# Patient Record
Sex: Female | Born: 1995 | Race: White | Hispanic: No | Marital: Single
Health system: Southern US, Community
[De-identification: ages and names within clinical notes are randomized; demographics above are authoritative.]

---

## 2016-05-25 ENCOUNTER — Emergency Department
Admission: EM | Admit: 2016-05-25 | Discharge: 2016-05-25 | Disposition: A | Discharge status: Home / Self Care | Payer: 59 | Attending: Emergency Medicine | Admitting: Emergency Medicine

## 2016-05-25 ENCOUNTER — Emergency Department: Payer: 59

## 2016-05-25 DIAGNOSIS — M940 Chondrocostal junction syndrome [Tietze]: Secondary | ICD-10-CM | POA: Diagnosis not present

## 2016-05-25 DIAGNOSIS — R05 Cough: Secondary | ICD-10-CM | POA: Diagnosis present

## 2016-05-25 DIAGNOSIS — B9789 Other viral agents as the cause of diseases classified elsewhere: Secondary | ICD-10-CM

## 2016-05-25 DIAGNOSIS — J069 Acute upper respiratory infection, unspecified: Secondary | ICD-10-CM | POA: Diagnosis not present

## 2016-05-25 MED ORDER — BENZONATATE 100 MG PO CAPS
200.0000 mg | ORAL_CAPSULE | Freq: Once | ORAL | Status: AC
  Administered 2016-05-25: 200 mg via ORAL
  Filled 2016-05-25: qty 2

## 2016-05-25 MED ORDER — IBUPROFEN 600 MG PO TABS
600.0000 mg | ORAL_TABLET | Freq: Once | ORAL | Status: AC
  Administered 2016-05-25: 600 mg via ORAL
  Filled 2016-05-25: qty 1

## 2016-05-25 NOTE — ED Notes (Signed)
Pt is in good condition, discharge instructions reviewed, follow up care and home care reviewed; pt verbalized understanding; pt is ambulatory and left ED with friends

## 2016-05-25 NOTE — ED Provider Notes (Signed)
[facility] Emergency Department Provider Note    First MD Initiated Contact with Patient 05/25/16 0411     (approximate)  I have reviewed the triage vital signs and the nursing notes.   HISTORY  Chief Complaint Cough    HPI Brittany Daugherty is a 20 y.o. female presents with cough congestion 3 days. Patient now admits to central/left rib/chest soreness. Patient states the pain is worse with deep inspiration. Pain score is currently 5 out of 10. Patient denies any fever afebrile on presentation.   Past medical history None There are no active problems to display for this patient.   Past surgical history None  Prior to Admission medications   Not on File    Allergies No known drug allergies No family history on file.  Social History Social History  Substance Use Topics  . Smoking status: Not on file  . Smokeless tobacco: Not on file  . Alcohol use Not on file    Review of Systems Constitutional: No fever/chills Eyes: No visual changes. ENT: No sore throat. Cardiovascular: Denies chest pain. Respiratory: Denies shortness of breath. Gastrointestinal: No abdominal pain.  No nausea, no vomiting.  No diarrhea.  No constipation. Genitourinary: Negative for dysuria. Musculoskeletal: Negative for back pain. Skin: Negative for rash. Neurological: Negative for headaches, focal weakness or numbness.  10-point ROS otherwise negative.  ____________________________________________   PHYSICAL EXAM:  VITAL SIGNS: ED Triage Vitals [05/25/16 0058]  Enc Vitals Group     BP 133/85     Pulse Rate 78     Resp 18     Temp 97.8 F (36.6 C)     Temp Source Oral     SpO2 100 %     Weight 135 lb (61.2 kg)     Height 5\' 5"  (1.651 m)     Head Circumference      Peak Flow      Pain Score 5     Pain Loc      Pain Edu?      Excl. in GC?     Constitutional: Alert and oriented. Well appearing and in no acute distress. Eyes: Conjunctivae are  normal. PERRL. EOMI. Head: Atraumatic. Ears:  Healthy appearing ear canals and TMs bilaterally Nose: No congestion/rhinnorhea. Mouth/Throat: Mucous membranes are moist.  Oropharynx non-erythematous. Neck: No stridor.   Cardiovascular: Normal rate, regular rhythm. Good peripheral circulation. Grossly normal heart sounds. Pain parasternal palpation Respiratory: Normal respiratory effort.  No retractions. Lungs CTAB. Gastrointestinal: Soft and nontender. No distention.  Musculoskeletal: No lower extremity tenderness nor edema. No gross deformities of extremities. Neurologic:  Normal speech and language. No gross focal neurologic deficits are appreciated.  Skin:  Skin is warm, dry and intact. No rash noted. Psychiatric: Mood and affect are normal. Speech and behavior are normal.  ___  RADIOLOGY I, Shawmut N Rhylin Venters, personally viewed and evaluated these images (plain radiographs) as part of my medical decision making, as well as reviewing the written report by the radiologist.  Dg Chest 2 View  Result Date: 05/25/2016 CLINICAL DATA:  Cough and congestion EXAM: CHEST  2 VIEW COMPARISON:  None. FINDINGS: Cardiomediastinal contours are normal. No pneumothorax or pleural effusion. No focal airspace consolidation or pulmonary edema. IMPRESSION: Clear lungs. Electronically Signed   By: Deatra RobinsonKevin  Herman M.D.   On: 05/25/2016 04:20      Procedures     INITIAL IMPRESSION / ASSESSMENT AND PLAN / ED COURSE  Pertinent labs & imaging results that were  available during my care of the patient were reviewed by me and considered in my medical decision making (see chart for details).     Clinical Course     ____________________________________________  FINAL CLINICAL IMPRESSION(S) / ED DIAGNOSES  Final diagnoses:  Viral URI with cough  Costochondritis, acute     MEDICATIONS GIVEN DURING THIS VISIT:  Medications  benzonatate (TESSALON) capsule 200 mg (200 mg Oral Given 05/25/16 0437)    ibuprofen (ADVIL,MOTRIN) tablet 600 mg (600 mg Oral Given 05/25/16 0438)     NEW OUTPATIENT MEDICATIONS STARTED DURING THIS VISIT:  New Prescriptions   No medications on file    Modified Medications   No medications on file    Discontinued Medications   No medications on file     Note:  This document was prepared using Dragon voice recognition software and may include unintentional dictation errors.    Darci Currentandolph N Mckennon Zwart, MD 05/25/16 [phone]

## 2016-05-25 NOTE — ED Triage Notes (Signed)
Pt in with co cough and congestion for few days, now having left rib and chest soreness.

## 2017-08-07 ENCOUNTER — Other Ambulatory Visit: Payer: Self-pay | Admitting: Neurology

## 2017-08-07 DIAGNOSIS — S060X0D Concussion without loss of consciousness, subsequent encounter: Secondary | ICD-10-CM

## 2017-08-14 ENCOUNTER — Ambulatory Visit
Admission: RE | Admit: 2017-08-14 | Discharge: 2017-08-14 | Disposition: A | Discharge status: Home / Self Care | Payer: 59 | Source: Ambulatory Visit | Attending: Neurology | Admitting: Neurology

## 2017-08-14 DIAGNOSIS — X58XXXD Exposure to other specified factors, subsequent encounter: Secondary | ICD-10-CM | POA: Insufficient documentation

## 2017-08-14 DIAGNOSIS — S060X0D Concussion without loss of consciousness, subsequent encounter: Secondary | ICD-10-CM | POA: Diagnosis present

## 2017-08-14 MED ORDER — GADOBENATE DIMEGLUMINE 529 MG/ML IV SOLN
12.0000 mL | Freq: Once | INTRAVENOUS | Status: AC | PRN
  Administered 2017-08-14: 12 mL via INTRAVENOUS

## 2019-03-01 IMAGING — MR MR HEAD WO/W CM
12 series · 48 of 48 positions shown · IV contrast (12mL MULTIHANCE)
Comparison: None.

CLINICAL DATA: Concussion 1 month ago with subsequent headaches and
dizziness, now predominately exertional.

EXAM:
MRI HEAD WITHOUT AND WITH CONTRAST
TECHNIQUE: Multiplanar, multiecho pulse sequences of the brain and surrounding
structures were obtained without and with intravenous contrast.
CONTRAST:  12mL MULTIHANCE GADOBENATE DIMEGLUMINE 529 MG/ML IV SOLN

[Series 2: T1 · sagittal · 5.0mm · 0.45mm/px · 1 of 23 slices shown (1 of 2)]
[im 1/23]
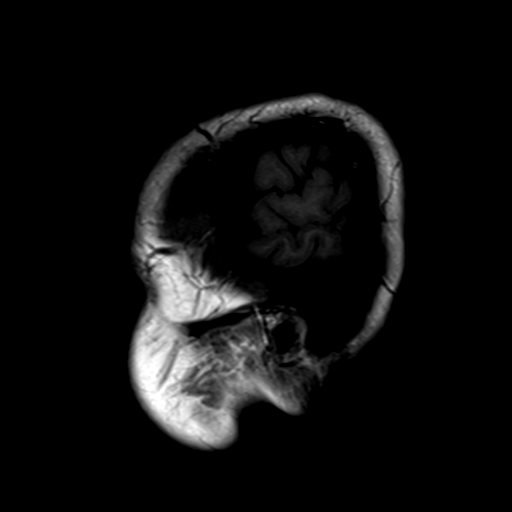

[Series 4: DWI · axial · 3.0mm · 1.80mm/px · z∈[-75,+85]mm · 4 of 55 slices shown (1 of 2)]
[im 1/55]
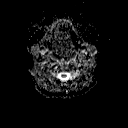
[im 19/55]
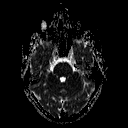
[im 37/55]
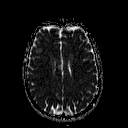
[im 55/55]
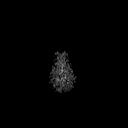

[Series 6: DWI · coronal · 3.0mm · 1.80mm/px · 3 of 47 slices shown (2 of 2)]
[im 1/47]
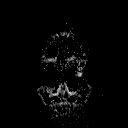
[im 24/47]
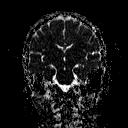
[im 47/47]
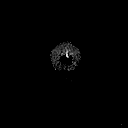

[Series 7: T2 · axial · 5.0mm · 0.60mm/px · z∈[-67,+87]mm · 2 of 25 slices shown (1 of 2)]
[im 1/25]
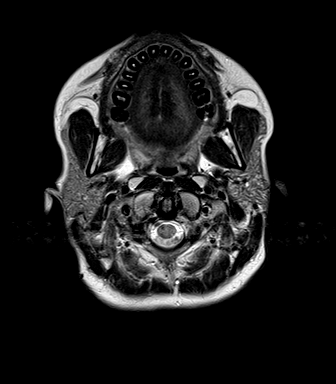
[im 25/25]
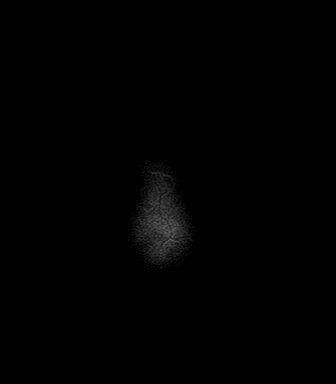

[Series 8: FLAIR · axial · 3.0mm · 0.45mm/px · z∈[-68,+86]mm · 3 of 53 slices shown]
[im 1/53]
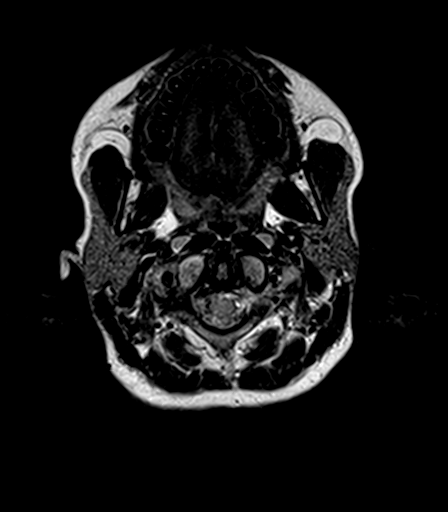
[im 27/53]
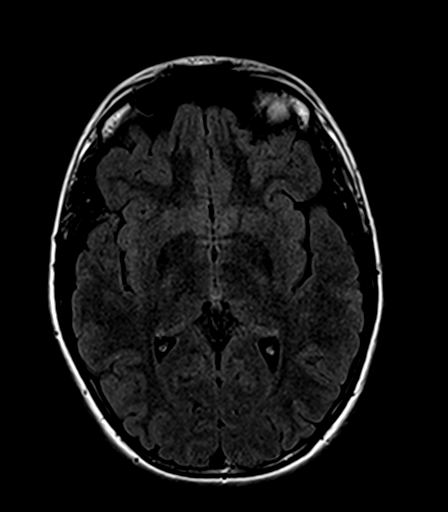
[im 53/53]
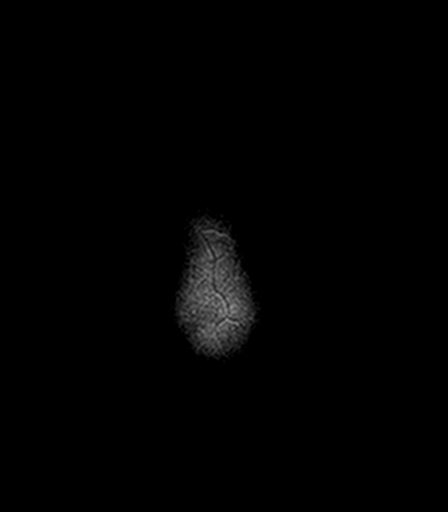

[Series 9: T2 · axial · 5.0mm · 0.45mm/px · z∈[-67,+87]mm · 2 of 25 slices shown (2 of 2)]
[im 1/25]
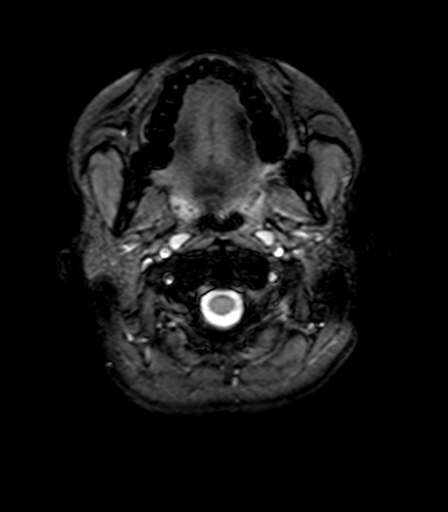
[im 25/25]
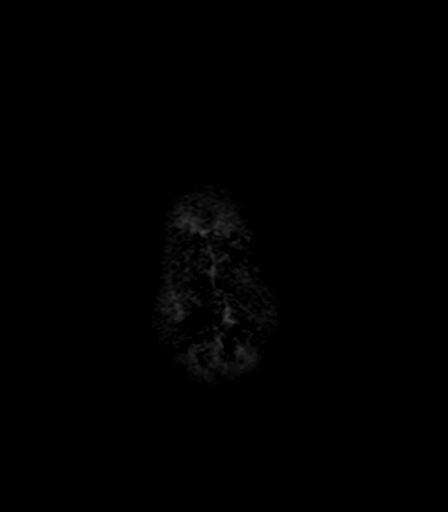

[Series 10: T1 · axial · 1.0mm · 1.00mm/px · z∈[-79,+94]mm · 11 of 176 slices shown (2 of 2)]
[im 1/176]
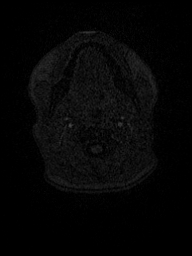
[im 18/176]
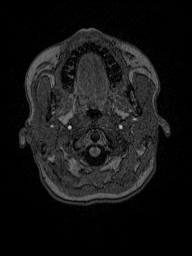
[im 36/176]
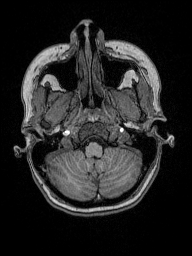
[im 53/176]
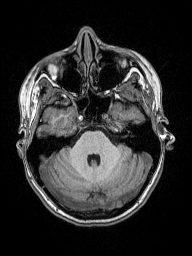
[im 71/176]
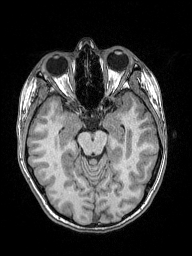
[im 88/176]
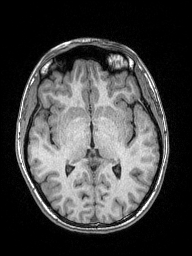
[im 106/176]
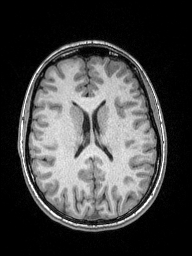
[im 123/176]
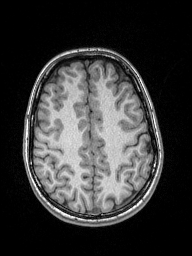
[im 141/176]
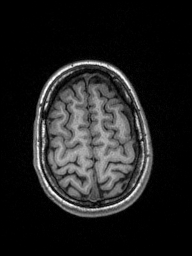
[im 158/176]
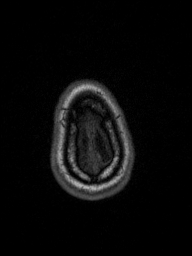
[im 176/176]
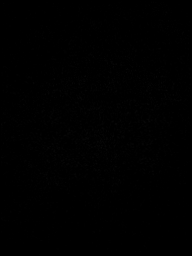

[Series 11: T2 post-contrast · coronal · 5.0mm · 0.49mm/px · 2 of 29 slices shown]
[im 1/29]
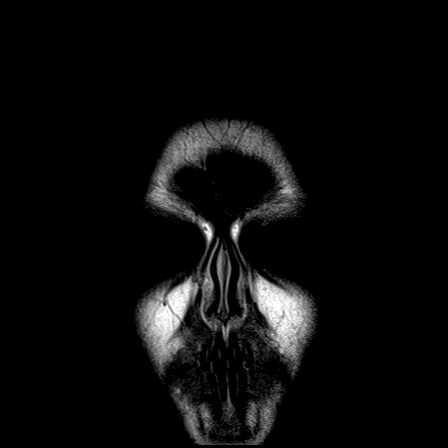
[im 29/29]
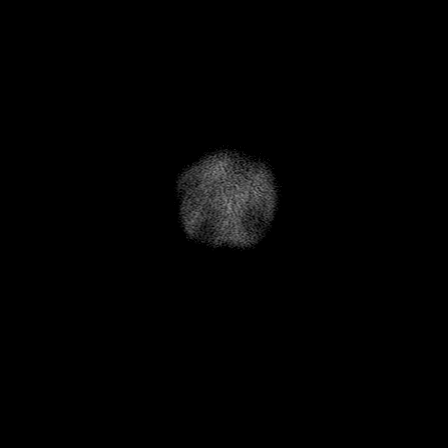

[Series 12: T1 post-contrast · axial · 1.0mm · 1.00mm/px · z∈[-79,+94]mm · 11 of 176 slices shown (1 of 2)]
[im 1/176]
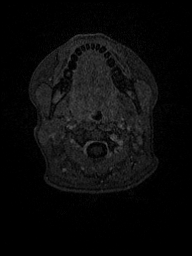
[im 18/176]
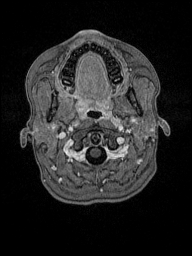
[im 36/176]
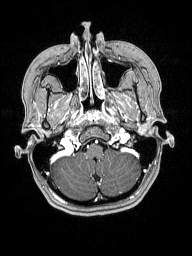
[im 53/176]
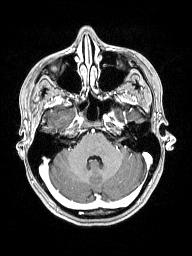
[im 71/176]
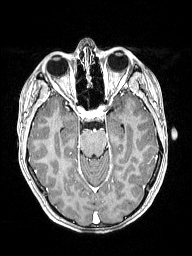
[im 88/176]
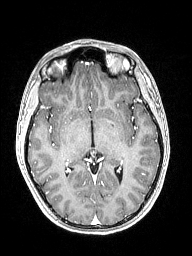
[im 106/176]
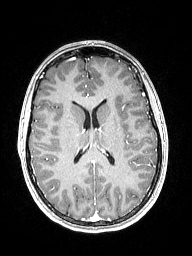
[im 123/176]
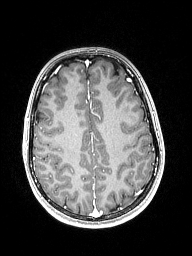
[im 141/176]
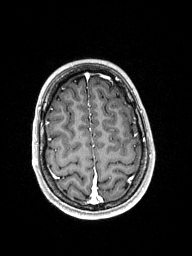
[im 158/176]
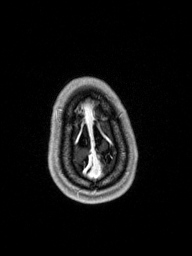
[im 176/176]
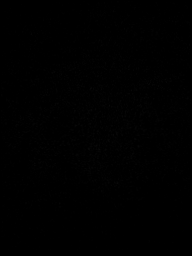

[Series 13: T1 post-contrast · coronal · 5.0mm · 0.43mm/px · 2 of 29 slices shown (2 of 2)]
[im 1/29]
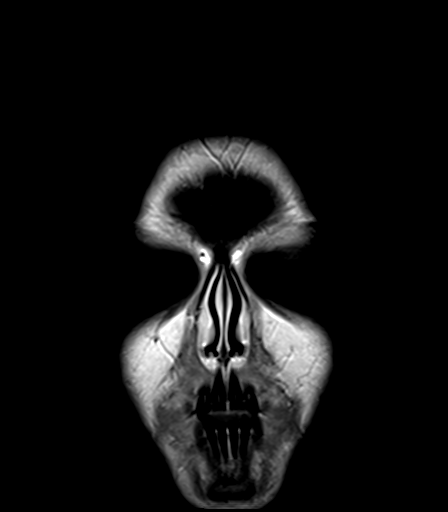
[im 29/29]
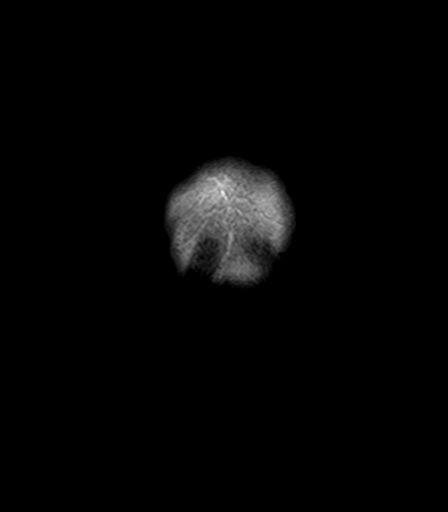

[Series 100: ax (id) · axial · 3.0mm · 1.80mm/px · z∈[-75,+85]mm · 4 of 55 slices shown]
[im 1/55]
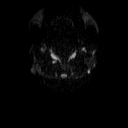
[im 19/55]
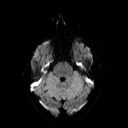
[im 37/55]
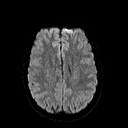
[im 55/55]
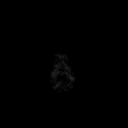

[Series 101: cor (id) · coronal · 3.0mm · 1.80mm/px · 3 of 45 slices shown]
[im 1/45]
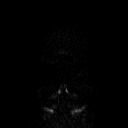
[im 23/45]
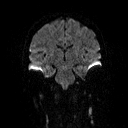
[im 45/45]
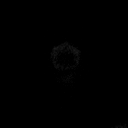

[48 of 48 positions shown; findings below may reference images not displayed]

FINDINGS: INTRACRANIAL CONTENTS: No reduced diffusion to suggest acute
ischemia or hyperacute demyelination. No susceptibility artifact to
suggest hemorrhage. The ventricles and sulci are normal for
patient's age. No suspicious parenchymal signal, masses, mass
effect. No abnormal intraparenchymal or extra-axial enhancement. No
abnormal extra-axial fluid collections. No extra-axial masses.

VASCULAR: Normal major intracranial vascular flow voids present at
skull base.

SKULL AND UPPER CERVICAL SPINE: No abnormal sellar expansion. No
suspicious calvarial bone marrow signal. Craniocervical junction
maintained.

SINUSES/ORBITS: Mild paranasal sinus mucosal thickening without
air-fluid levels. Mastoid air cells are well aerated.The included
ocular globes and orbital contents are non-suspicious.

OTHER: None.
IMPRESSION: Normal MRI of the head with and without contrast.
